# Patient Record
Sex: Male | Born: 1965 | Race: White | Hispanic: No | Marital: Single | Smoking: Current every day smoker
Health system: Southern US, Community
[De-identification: ages and names within clinical notes are randomized; demographics above are authoritative.]

## PROBLEM LIST (undated history)

## (undated) DIAGNOSIS — F1990 Other psychoactive substance use, unspecified, uncomplicated: Secondary | ICD-10-CM

## (undated) HISTORY — PX: ANKLE FUSION: SHX881

---

## 2016-01-06 ENCOUNTER — Emergency Department: Payer: No Typology Code available for payment source

## 2016-01-06 ENCOUNTER — Emergency Department
Admission: EM | Admit: 2016-01-06 | Discharge: 2016-01-07 | Disposition: A | Discharge status: Home / Self Care | Payer: No Typology Code available for payment source | Attending: Emergency Medicine | Admitting: Emergency Medicine

## 2016-01-06 ENCOUNTER — Encounter: Payer: Self-pay | Admitting: Emergency Medicine

## 2016-01-06 DIAGNOSIS — R400 Somnolence: Secondary | ICD-10-CM | POA: Diagnosis not present

## 2016-01-06 DIAGNOSIS — S199XXA Unspecified injury of neck, initial encounter: Secondary | ICD-10-CM | POA: Diagnosis present

## 2016-01-06 DIAGNOSIS — Y9389 Activity, other specified: Secondary | ICD-10-CM | POA: Insufficient documentation

## 2016-01-06 DIAGNOSIS — Z5181 Encounter for therapeutic drug level monitoring: Secondary | ICD-10-CM | POA: Diagnosis not present

## 2016-01-06 DIAGNOSIS — S161XXA Strain of muscle, fascia and tendon at neck level, initial encounter: Secondary | ICD-10-CM | POA: Diagnosis not present

## 2016-01-06 DIAGNOSIS — F17210 Nicotine dependence, cigarettes, uncomplicated: Secondary | ICD-10-CM | POA: Diagnosis not present

## 2016-01-06 DIAGNOSIS — Y92410 Unspecified street and highway as the place of occurrence of the external cause: Secondary | ICD-10-CM | POA: Insufficient documentation

## 2016-01-06 DIAGNOSIS — Y999 Unspecified external cause status: Secondary | ICD-10-CM | POA: Insufficient documentation

## 2016-01-06 LAB — TROPONIN I: Troponin I: <0.03 ng/mL (ref <0.03)

## 2016-01-06 LAB — COMPREHENSIVE METABOLIC PANEL
ALK PHOS: 57 U/L (ref 38–126)
ALT: 26 U/L (ref 17–63)
ANION GAP: 7 (ref 5–15)
AST: 37 U/L (ref 15–41)
Albumin: 4.1 g/dL (ref 3.5–5.0)
BUN: 10 mg/dL (ref 6–20)
CALCIUM: 9 mg/dL (ref 8.9–10.3)
CO2: 26 mmol/L (ref 22–32)
CREATININE: 0.91 mg/dL (ref 0.61–1.24)
Chloride: 104 mmol/L (ref 101–111)
GFR calc Af Amer: >60 mL/min (ref >60)
Glucose, Bld: 104 mg/dL — ABNORMAL HIGH (ref 65–99)
Potassium: 3.2 mmol/L — ABNORMAL LOW (ref 3.5–5.1)
SODIUM: 137 mmol/L (ref 135–145)
TOTAL PROTEIN: 7.2 g/dL (ref 6.5–8.1)
Total Bilirubin: 0.9 mg/dL (ref 0.3–1.2)

## 2016-01-06 LAB — CBC
HCT: 38.3 % — ABNORMAL LOW (ref 40.0–52.0)
HEMOGLOBIN: 13.3 g/dL (ref 13.0–18.0)
MCH: 26.5 pg (ref 26.0–34.0)
MCHC: 34.8 g/dL (ref 32.0–36.0)
MCV: 76.2 fL — AB (ref 80.0–100.0)
Platelets: 262 10*3/uL (ref 150–440)
RBC: 5.02 MIL/uL (ref 4.40–5.90)
RDW: 15.2 % — ABNORMAL HIGH (ref 11.5–14.5)
WBC: 12.3 10*3/uL — ABNORMAL HIGH (ref 3.8–10.6)

## 2016-01-06 LAB — URINE DRUG SCREEN, QUALITATIVE (ARMC ONLY)
Amphetamines, Ur Screen: NOT DETECTED
BARBITURATES, UR SCREEN: NOT DETECTED
BENZODIAZEPINE, UR SCRN: NOT DETECTED
CANNABINOID 50 NG, UR ~~LOC~~: POSITIVE — AB
Cocaine Metabolite,Ur ~~LOC~~: NOT DETECTED
MDMA (ECSTASY) UR SCREEN: NOT DETECTED
Methadone Scn, Ur: NOT DETECTED
Opiate, Ur Screen: POSITIVE — AB
PHENCYCLIDINE (PCP) UR S: NOT DETECTED
TRICYCLIC, UR SCREEN: NOT DETECTED

## 2016-01-06 LAB — ETHANOL

## 2016-01-06 MED ORDER — NAPROXEN 500 MG PO TABS: 500.0000 mg | ORAL_TABLET | Freq: Two times a day (BID) | ORAL | 2 refills | Status: AC
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
## 2016-01-06 NOTE — ED Notes (Signed)
ED Provider at bedside. 
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
## 2016-01-06 NOTE — ED Notes (Signed)
Hooked pt up to monitor.

## 2016-01-06 NOTE — ED Notes (Signed)
Report given to care nurse Dora SimsSonjia, RN
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
## 2023-02-16 ENCOUNTER — Other Ambulatory Visit: Payer: Self-pay

## 2023-02-16 ENCOUNTER — Emergency Department
Admission: EM | Admit: 2023-02-16 | Discharge: 2023-02-17 | Discharge status: Against Medical Advice | Payer: Self-pay | Attending: Emergency Medicine | Admitting: Emergency Medicine

## 2023-02-16 DIAGNOSIS — Z5321 Procedure and treatment not carried out due to patient leaving prior to being seen by health care provider: Secondary | ICD-10-CM | POA: Insufficient documentation

## 2023-02-16 DIAGNOSIS — R112 Nausea with vomiting, unspecified: Secondary | ICD-10-CM | POA: Insufficient documentation

## 2023-02-16 DIAGNOSIS — F1113 Opioid abuse with withdrawal: Secondary | ICD-10-CM | POA: Insufficient documentation

## 2023-02-16 HISTORY — DX: Other psychoactive substance use, unspecified, uncomplicated: F19.90

## 2023-02-16 LAB — CBC WITH DIFFERENTIAL/PLATELET
Abs Immature Granulocytes: 0.04 10*3/uL (ref 0.00–0.07)
Basophils Absolute: 0 10*3/uL (ref 0.0–0.1)
Basophils Relative: 0 %
Eosinophils Absolute: 0.1 10*3/uL (ref 0.0–0.5)
Eosinophils Relative: 1 %
HCT: 41.7 % (ref 39.0–52.0)
Hemoglobin: 14.8 g/dL (ref 13.0–17.0)
Immature Granulocytes: 0 %
Lymphocytes Relative: 10 %
Lymphs Abs: 1 10*3/uL (ref 0.7–4.0)
MCH: 25.1 pg — ABNORMAL LOW (ref 26.0–34.0)
MCHC: 35.5 g/dL (ref 30.0–36.0)
MCV: 70.8 fL — ABNORMAL LOW (ref 80.0–100.0)
Monocytes Absolute: 0.3 10*3/uL (ref 0.1–1.0)
Monocytes Relative: 3 %
Neutro Abs: 8.5 10*3/uL — ABNORMAL HIGH (ref 1.7–7.7)
Neutrophils Relative %: 86 %
Platelets: 375 10*3/uL (ref 150–400)
RBC: 5.89 MIL/uL — ABNORMAL HIGH (ref 4.22–5.81)
RDW: 14.6 % (ref 11.5–15.5)
Smear Review: NORMAL
WBC: 10 10*3/uL (ref 4.0–10.5)
nRBC: 0 % (ref 0.0–0.2)

## 2023-02-16 LAB — COMPREHENSIVE METABOLIC PANEL
ALT: 16 U/L (ref 0–44)
AST: 19 U/L (ref 15–41)
Albumin: 4.5 g/dL (ref 3.5–5.0)
Alkaline Phosphatase: 88 U/L (ref 38–126)
Anion gap: 14 (ref 5–15)
BUN: 11 mg/dL (ref 6–20)
CO2: 21 mmol/L — ABNORMAL LOW (ref 22–32)
Calcium: 9.9 mg/dL (ref 8.9–10.3)
Chloride: 105 mmol/L (ref 98–111)
Creatinine, Ser: 1.09 mg/dL (ref 0.61–1.24)
GFR, Estimated: >60 mL/min (ref >60)
Glucose, Bld: 114 mg/dL — ABNORMAL HIGH (ref 70–99)
Potassium: 3.2 mmol/L — ABNORMAL LOW (ref 3.5–5.1)
Sodium: 140 mmol/L (ref 135–145)
Total Bilirubin: 1.3 mg/dL — ABNORMAL HIGH (ref 0.0–1.2)
Total Protein: 8.6 g/dL — ABNORMAL HIGH (ref 6.5–8.1)

## 2023-02-16 LAB — TROPONIN I (HIGH SENSITIVITY): Troponin I (High Sensitivity): 8 ng/L (ref <18)

## 2023-02-16 NOTE — ED Triage Notes (Signed)
Arrives fro home via [facility].  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 Today-- N/V. AOx3.  Possible withdral.  171/105 98% RA 77 16

## 2023-02-16 NOTE — ED Provider Triage Note (Signed)
Emergency Medicine Provider Triage Evaluation Note  Kerrington Greenhalgh , a 58 y.o. male  was evaluated in triage.  Pt complains of opioid withdrawals.  Has not used since early yesterday.  Having nausea and vomiting.  Called EMS because he was having some vague thoughts of suicide but currently denies any suicidal homicidal ideation..  Review of Systems  Positive:  Negative:   Physical Exam  BP (!) 133/92 (BP Location: Left Arm)   Pulse 77   Temp 98.3 F (36.8 C) (Oral)   Resp (!) 21   SpO2 100%  Gen:   Awake, no distress   Resp:  Normal effort  MSK:   Moves extremities without difficulty  Other:    Medical Decision Making  Medically screening exam initiated at 12:53 PM.  Appropriate orders placed.  Tramayne Sebesta was informed that the remainder of the evaluation will be completed by another provider, this initial triage assessment does not replace that evaluation, and the importance of remaining in the ED until their evaluation is complete.  Patient here for suspected opiate withdrawal having only nausea.  Denies SI or HI but did have some recently.  Asked for steroids to room soon as possible.   Christen Bame, [place] 02/16/23 [id number]

## 2023-02-16 NOTE — ED Triage Notes (Addendum)
Pt states he took heroin (IV route) yesterday and is withdrawing today. Pt c/o emesis, hot sweats, fatigue, weakness. Pt is AOX4, appears diaphoretic and restless. Pt denies any pain.
